# Patient Record
Sex: Male | Born: 1990 | Race: White | Hispanic: No | Marital: Married | Smoking: Never smoker
Health system: Southern US, Community
[De-identification: ages and names within clinical notes are randomized; demographics above are authoritative.]

## PROBLEM LIST (undated history)

## (undated) DIAGNOSIS — J45909 Unspecified asthma, uncomplicated: Secondary | ICD-10-CM

## (undated) DIAGNOSIS — F410 Panic disorder [episodic paroxysmal anxiety] without agoraphobia: Secondary | ICD-10-CM

## (undated) DIAGNOSIS — J302 Other seasonal allergic rhinitis: Secondary | ICD-10-CM

## (undated) HISTORY — DX: Panic disorder (episodic paroxysmal anxiety): F41.0

---

## 2016-11-08 HISTORY — PX: REFRACTIVE SURGERY: SHX103

## 2018-02-21 ENCOUNTER — Emergency Department
Admission: EM | Admit: 2018-02-21 | Discharge: 2018-02-21 | Disposition: A | Discharge status: Home / Self Care | Payer: Self-pay | Attending: Emergency Medicine | Admitting: Emergency Medicine

## 2018-02-21 ENCOUNTER — Other Ambulatory Visit: Payer: Self-pay

## 2018-02-21 ENCOUNTER — Encounter: Payer: Self-pay | Admitting: Emergency Medicine

## 2018-02-21 DIAGNOSIS — R062 Wheezing: Secondary | ICD-10-CM | POA: Insufficient documentation

## 2018-02-21 DIAGNOSIS — J301 Allergic rhinitis due to pollen: Secondary | ICD-10-CM | POA: Insufficient documentation

## 2018-02-21 DIAGNOSIS — R064 Hyperventilation: Secondary | ICD-10-CM | POA: Insufficient documentation

## 2018-02-21 HISTORY — DX: Other seasonal allergic rhinitis: J30.2

## 2018-02-21 HISTORY — DX: Unspecified asthma, uncomplicated: J45.909

## 2018-02-21 LAB — COMPREHENSIVE METABOLIC PANEL
ALT: 13 U/L — AB (ref 17–63)
AST: 33 U/L (ref 15–41)
Albumin: 4.9 g/dL (ref 3.5–5.0)
Alkaline Phosphatase: 67 U/L (ref 38–126)
Anion gap: 12 (ref 5–15)
BUN: 17 mg/dL (ref 6–20)
CHLORIDE: 106 mmol/L (ref 101–111)
CO2: 18 mmol/L — AB (ref 22–32)
CREATININE: 0.9 mg/dL (ref 0.61–1.24)
Calcium: 9.7 mg/dL (ref 8.9–10.3)
GFR calc Af Amer: >60 mL/min (ref >60)
GLUCOSE: 93 mg/dL (ref 65–99)
POTASSIUM: 3.2 mmol/L — AB (ref 3.5–5.1)
SODIUM: 136 mmol/L (ref 135–145)
Total Bilirubin: 0.7 mg/dL (ref 0.3–1.2)
Total Protein: 8.4 g/dL — ABNORMAL HIGH (ref 6.5–8.1)

## 2018-02-21 LAB — CBC WITH DIFFERENTIAL/PLATELET
Basophils Absolute: 0 10*3/uL (ref 0–0.1)
Basophils Relative: 1 %
EOS ABS: 0.1 10*3/uL (ref 0–0.7)
EOS PCT: 2 %
HCT: 44.8 % (ref 40.0–52.0)
Hemoglobin: 15.2 g/dL (ref 13.0–18.0)
LYMPHS ABS: 2 10*3/uL (ref 1.0–3.6)
LYMPHS PCT: 35 %
MCH: 30.8 pg (ref 26.0–34.0)
MCHC: 34 g/dL (ref 32.0–36.0)
MCV: 90.6 fL (ref 80.0–100.0)
Monocytes Absolute: 0.4 10*3/uL (ref 0.2–1.0)
Monocytes Relative: 7 %
Neutro Abs: 3.2 10*3/uL (ref 1.4–6.5)
Neutrophils Relative %: 55 %
PLATELETS: 224 10*3/uL (ref 150–440)
RBC: 4.95 MIL/uL (ref 4.40–5.90)
RDW: 13.6 % (ref 11.5–14.5)
WBC: 5.7 10*3/uL (ref 3.8–10.6)

## 2018-02-21 MED ORDER — IPRATROPIUM-ALBUTEROL 0.5-2.5 (3) MG/3ML IN SOLN
3.0000 mL | Freq: Once | RESPIRATORY_TRACT | Status: AC
  Administered 2018-02-21: 3 mL via RESPIRATORY_TRACT
  Filled 2018-02-21: qty 3

## 2018-02-21 MED ORDER — DIPHENHYDRAMINE HCL 50 MG/ML IJ SOLN
50.0000 mg | Freq: Once | INTRAMUSCULAR | Status: AC
  Administered 2018-02-21: 50 mg via INTRAVENOUS
  Filled 2018-02-21: qty 1

## 2018-02-21 MED ORDER — SODIUM CHLORIDE 0.9 % IV BOLUS
1000.0000 mL | Freq: Once | INTRAVENOUS | Status: AC
  Administered 2018-02-21: 1000 mL via INTRAVENOUS

## 2018-02-21 MED ORDER — METHYLPREDNISOLONE SODIUM SUCC 125 MG IJ SOLR
125.0000 mg | Freq: Once | INTRAMUSCULAR | Status: AC
  Administered 2018-02-21: 125 mg via INTRAVENOUS
  Filled 2018-02-21: qty 2

## 2018-02-21 MED ORDER — PREDNISONE 50 MG PO TABS: 50.0000 mg | ORAL_TABLET | Freq: Every day | ORAL | 0 refills | Status: DC

## 2018-02-21 MED ORDER — LORAZEPAM 2 MG/ML IJ SOLN
1.0000 mg | Freq: Once | INTRAMUSCULAR | Status: AC
  Administered 2018-02-21: 1 mg via INTRAVENOUS
  Filled 2018-02-21: qty 1
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
## 2018-02-21 NOTE — ED Triage Notes (Signed)
Pt was out on patrol and began coughing. He usually has trouble with pollen this time of year. Pt is hyperventilating. His hand are cramped up.

## 2019-01-13 ENCOUNTER — Encounter: Payer: Self-pay | Admitting: Emergency Medicine

## 2019-01-13 ENCOUNTER — Other Ambulatory Visit: Payer: Self-pay

## 2019-01-13 ENCOUNTER — Emergency Department: Payer: No Typology Code available for payment source

## 2019-01-13 ENCOUNTER — Emergency Department
Admission: EM | Admit: 2019-01-13 | Discharge: 2019-01-13 | Disposition: A | Discharge status: Home / Self Care | Payer: No Typology Code available for payment source | Attending: Emergency Medicine | Admitting: Emergency Medicine

## 2019-01-13 DIAGNOSIS — Y990 Civilian activity done for income or pay: Secondary | ICD-10-CM | POA: Insufficient documentation

## 2019-01-13 DIAGNOSIS — J45909 Unspecified asthma, uncomplicated: Secondary | ICD-10-CM | POA: Diagnosis not present

## 2019-01-13 DIAGNOSIS — Y9389 Activity, other specified: Secondary | ICD-10-CM | POA: Insufficient documentation

## 2019-01-13 DIAGNOSIS — W51XXXA Accidental striking against or bumped into by another person, initial encounter: Secondary | ICD-10-CM | POA: Insufficient documentation

## 2019-01-13 DIAGNOSIS — N50819 Testicular pain, unspecified: Secondary | ICD-10-CM

## 2019-01-13 DIAGNOSIS — S3994XA Unspecified injury of external genitals, initial encounter: Secondary | ICD-10-CM | POA: Diagnosis present

## 2019-01-13 DIAGNOSIS — Y929 Unspecified place or not applicable: Secondary | ICD-10-CM | POA: Insufficient documentation
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
## 2019-09-21 DIAGNOSIS — L508 Other urticaria: Secondary | ICD-10-CM | POA: Diagnosis not present

## 2020-01-12 DIAGNOSIS — Z23 Encounter for immunization: Secondary | ICD-10-CM | POA: Diagnosis not present

## 2020-02-02 DIAGNOSIS — Z23 Encounter for immunization: Secondary | ICD-10-CM | POA: Diagnosis not present
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
## 2020-05-01 ENCOUNTER — Other Ambulatory Visit: Payer: 59

## 2020-05-02 ENCOUNTER — Other Ambulatory Visit: Payer: Self-pay

## 2020-05-02 NOTE — Progress Notes (Signed)
Random urine drug screen collected on [facility] [id number] and released to [facility].  BAT testing conducted by Jeanie Cooks, CMA.

## 2020-07-10 ENCOUNTER — Other Ambulatory Visit: Payer: Self-pay

## 2020-07-10 DIAGNOSIS — Z0000 Encounter for general adult medical examination without abnormal findings: Secondary | ICD-10-CM

## 2020-07-10 LAB — POCT URINALYSIS DIPSTICK
Bilirubin, UA: NEGATIVE
Blood, UA: POSITIVE
Glucose, UA: NEGATIVE
Ketones, UA: NEGATIVE
Leukocytes, UA: NEGATIVE
Nitrite, UA: NEGATIVE
Protein, UA: NEGATIVE
Spec Grav, UA: >=1.03 — AB (ref 1.010–1.025)
Urobilinogen, UA: 0.2 E.U./dL
pH, UA: 5.5 (ref 5.0–8.0)

## 2020-07-10 NOTE — Progress Notes (Signed)
Scheduled to complete physical with  Durward Parcel, PA-C.  AMD

## 2020-07-11 LAB — CMP12+LP+TP+TSH+6AC+CBC/D/PLT
ALT: 13 IU/L (ref 0–44)
AST: 20 IU/L (ref 0–40)
Albumin/Globulin Ratio: 1.6 (ref 1.2–2.2)
Albumin: 4.6 g/dL (ref 4.1–5.2)
Alkaline Phosphatase: 64 IU/L (ref 48–121)
BUN/Creatinine Ratio: 14 (ref 9–20)
BUN: 14 mg/dL (ref 6–20)
Basophils Absolute: 0.1 10*3/uL (ref 0.0–0.2)
Basos: 1 %
Bilirubin Total: 0.3 mg/dL (ref 0.0–1.2)
Calcium: 9.7 mg/dL (ref 8.7–10.2)
Chloride: 103 mmol/L (ref 96–106)
Chol/HDL Ratio: 4.3 ratio (ref 0.0–5.0)
Cholesterol, Total: 180 mg/dL (ref 100–199)
Creatinine, Ser: 1.01 mg/dL (ref 0.76–1.27)
EOS (ABSOLUTE): 0.2 10*3/uL (ref 0.0–0.4)
Eos: 3 %
Estimated CHD Risk: 0.8 times avg. (ref 0.0–1.0)
Free Thyroxine Index: 2 (ref 1.2–4.9)
GFR calc Af Amer: 116 mL/min/{1.73_m2} (ref >59)
GFR calc non Af Amer: 100 mL/min/{1.73_m2} (ref >59)
GGT: 23 IU/L (ref 0–65)
Globulin, Total: 2.9 g/dL (ref 1.5–4.5)
Glucose: 96 mg/dL (ref 65–99)
HDL: 42 mg/dL (ref >39)
Hematocrit: 43.6 % (ref 37.5–51.0)
Hemoglobin: 14.3 g/dL (ref 13.0–17.7)
Immature Grans (Abs): 0 10*3/uL (ref 0.0–0.1)
Immature Granulocytes: 1 %
Iron: 74 ug/dL (ref 38–169)
LDH: 142 IU/L (ref 121–224)
LDL Chol Calc (NIH): 119 mg/dL — ABNORMAL HIGH (ref 0–99)
Lymphocytes Absolute: 2.1 10*3/uL (ref 0.7–3.1)
Lymphs: 37 %
MCH: 29.4 pg (ref 26.6–33.0)
MCHC: 32.8 g/dL (ref 31.5–35.7)
MCV: 90 fL (ref 79–97)
Monocytes Absolute: 0.5 10*3/uL (ref 0.1–0.9)
Monocytes: 8 %
Neutrophils Absolute: 2.9 10*3/uL (ref 1.4–7.0)
Neutrophils: 50 %
Phosphorus: 4.1 mg/dL (ref 2.8–4.1)
Platelets: 214 10*3/uL (ref 150–450)
Potassium: 4.5 mmol/L (ref 3.5–5.2)
RBC: 4.87 x10E6/uL (ref 4.14–5.80)
RDW: 12.7 % (ref 11.6–15.4)
Sodium: 138 mmol/L (ref 134–144)
T3 Uptake Ratio: 26 % (ref 24–39)
T4, Total: 7.8 ug/dL (ref 4.5–12.0)
TSH: 4.56 u[IU]/mL — ABNORMAL HIGH (ref 0.450–4.500)
Total Protein: 7.5 g/dL (ref 6.0–8.5)
Triglycerides: 106 mg/dL (ref 0–149)
Uric Acid: 5.5 mg/dL (ref 3.8–8.4)
VLDL Cholesterol Cal: 19 mg/dL (ref 5–40)
WBC: 5.7 10*3/uL (ref 3.4–10.8)

## 2020-07-17 ENCOUNTER — Encounter: Payer: Self-pay | Admitting: Physician Assistant

## 2020-07-18 ENCOUNTER — Ambulatory Visit: Payer: Self-pay | Admitting: Physician Assistant

## 2020-07-18 ENCOUNTER — Other Ambulatory Visit: Payer: Self-pay

## 2020-07-18 ENCOUNTER — Encounter: Payer: Self-pay | Admitting: Physician Assistant

## 2020-07-18 VITALS — BP 138/98 | HR 82 | Temp 98.7°F | Resp 16 | Ht 71.0 in | Wt 166.0 lb

## 2020-07-18 DIAGNOSIS — Z0000 Encounter for general adult medical examination without abnormal findings: Secondary | ICD-10-CM

## 2020-07-18 NOTE — Progress Notes (Signed)
   Subjective: Physical exam    Patient ID: Ryan Underwood, male    DOB: 04/20/1991, 29 y.o.   MRN: [id number]  HPI Patient presents for physical exam, voices no complaints or concerns.   Review of Systems    Negative Objective:   Physical Exam No acute distress.  HEENT is unremarkable.  Neck is supple for adenopathy or bruits.  Lungs are clear to auscultation.  Heart is regular rate and rhythm.  Abdomen with negative HSM, normoactive bowel sounds, soft and nontender to palpation.  No obvious deformity to the upper or lower extremities.  Patient has full and equal range of motion of the upper and lower extremities.  No obvious deformity to the cervical or lumbar spine.  Patient has full and equal range of motion of the cervical and lumbar spine.  Cranial nerves II through XII grossly intact.       Assessment & Plan: Well exam.  Discussed lab results with patient.  TSH was mildly elevated.  Patient has a history of hypothyroidism in his family.  We will repeat TSH in 6 months.

## 2020-09-18 ENCOUNTER — Other Ambulatory Visit: Payer: Self-pay

## 2020-09-18 DIAGNOSIS — Z0283 Encounter for blood-alcohol and blood-drug test: Secondary | ICD-10-CM

## 2020-09-18 NOTE — Progress Notes (Signed)
Presentsfor random drug screen & alcoholscreen.  [facility] acct # [id number] [facility] Specimen # [id number]  AMD

## 2020-10-24 ENCOUNTER — Other Ambulatory Visit: Payer: Self-pay

## 2020-10-24 ENCOUNTER — Ambulatory Visit: Payer: Self-pay | Admitting: Physician Assistant

## 2020-10-24 ENCOUNTER — Encounter: Payer: Self-pay | Admitting: Physician Assistant

## 2020-10-24 VITALS — BP 123/83 | HR 90 | Temp 97.9°F | Resp 14 | Ht 72.0 in | Wt 170.0 lb

## 2020-10-24 DIAGNOSIS — R300 Dysuria: Secondary | ICD-10-CM

## 2020-10-24 DIAGNOSIS — Z87442 Personal history of urinary calculi: Secondary | ICD-10-CM

## 2020-10-24 LAB — POCT URINALYSIS DIPSTICK
Bilirubin, UA: NEGATIVE
Glucose, UA: NEGATIVE
Ketones, UA: NEGATIVE
Leukocytes, UA: NEGATIVE
Nitrite, UA: NEGATIVE
Protein, UA: NEGATIVE
Spec Grav, UA: 1.025 (ref 1.010–1.025)
Urobilinogen, UA: 0.2 E.U./dL
pH, UA: 5.5 (ref 5.0–8.0)

## 2020-10-24 MED ORDER — ONDANSETRON HCL 8 MG PO TABS: 8.0000 mg | ORAL_TABLET | Freq: Three times a day (TID) | ORAL | 0 refills | Status: DC | PRN

## 2020-10-24 MED ORDER — TAMSULOSIN HCL 0.4 MG PO CAPS: 0.4000 mg | ORAL_CAPSULE | Freq: Every day | ORAL | 3 refills | Status: DC

## 2020-10-24 NOTE — Progress Notes (Signed)
° °  Subjective: Left leg pain    Patient ID: Ryan Underwood, male    DOB: 1991/09/23, 29 y.o.   MRN: [id number]  HPI Patient presents with left flank pain, mild dysuria, and hematuria.  Patient has history of kidney stones.  Patient rates pain as a 4/10.  Prescribed pain is "achy".  Patient denies any fever chills associated with complaint.  Patient also states nausea but no vomiting. Review of Systems    Negative except for complaint. Objective:   Physical Exam Patient no acute distress.  Patient has no guarding palpation left CVA area.  Patient has a moderate left abdominal pain.  Patient here was negative for hematuria.       Assessment & Plan: Flank pain  Right elbow patient presents with left flank pain with suspicion for kidney stone.  Patient given discharge care instruction advised to strain his urine.  Patient given prescription for Flomax and Zofran.  Patient advised to follow-up with the emergency room to consider CT scan if no improvement or worsening of pain.  Patient also advised that she have any urinary retention to follow-up with the emergency room.  Patient will follow up in 3 days for reevaluation.

## 2020-10-27 LAB — URINE CULTURE: Organism ID, Bacteria: NO GROWTH

## 2020-11-12 DIAGNOSIS — Z20822 Contact with and (suspected) exposure to covid-19: Secondary | ICD-10-CM | POA: Diagnosis not present

## 2020-11-12 DIAGNOSIS — Z03818 Encounter for observation for suspected exposure to other biological agents ruled out: Secondary | ICD-10-CM | POA: Diagnosis not present

## 2020-11-12 DIAGNOSIS — U071 COVID-19: Secondary | ICD-10-CM | POA: Diagnosis not present

## 2020-11-17 DIAGNOSIS — Z20822 Contact with and (suspected) exposure to covid-19: Secondary | ICD-10-CM | POA: Diagnosis not present

## 2020-11-17 DIAGNOSIS — Z03818 Encounter for observation for suspected exposure to other biological agents ruled out: Secondary | ICD-10-CM | POA: Diagnosis not present

## 2020-12-24 DIAGNOSIS — Z03818 Encounter for observation for suspected exposure to other biological agents ruled out: Secondary | ICD-10-CM | POA: Diagnosis not present

## 2020-12-24 DIAGNOSIS — Z20822 Contact with and (suspected) exposure to covid-19: Secondary | ICD-10-CM | POA: Diagnosis not present

## 2021-09-07 ENCOUNTER — Ambulatory Visit: Payer: Self-pay | Admitting: Physician Assistant

## 2021-09-08 ENCOUNTER — Ambulatory Visit: Payer: Self-pay | Admitting: Physician Assistant

## 2021-09-08 ENCOUNTER — Encounter: Payer: Self-pay | Admitting: Physician Assistant

## 2021-09-08 ENCOUNTER — Other Ambulatory Visit: Payer: Self-pay

## 2021-09-08 VITALS — BP 123/90 | HR 93 | Temp 98.2°F | Resp 12

## 2021-09-08 DIAGNOSIS — S93402A Sprain of unspecified ligament of left ankle, initial encounter: Secondary | ICD-10-CM

## 2021-09-08 NOTE — Progress Notes (Signed)
   Subjective: Left ankle sprain    Patient ID: Laureen Ochs, male    DOB: Nov 02, 1991, 30 y.o.   MRN: [id number]  HPI Patient presents for evaluation of left ankle sprain which occurred on 08/31/2021.  Patient state he was walking down steps and slipped but did not fall twisting his left ankle.  Patient said there was mild edema and pain treated with ibuprofen.  Patient stated pain is completely resolved and he is able to ambulate without discomfort.   Review of Systems Negative except for complaint    Objective:   Physical Exam No acute distress.  Temperature is 98.2, pulse 93, respiration 12, BP is 123/90, patient 97% O2 sat on room air.  Examination of the left ankle shows no obvious deformity, ecchymosis, or edema.  Palpable posterior tibial and dorsalis pedis pulse.  Patient has full and equal l range of motion.  No laxity with stress testing.  Normal gait.  Patient able to heel and toe walk.       Assessment & Plan: Left ankle sprain   Based on patient complaint physical exam he returned back to duty without limitations.

## 2022-11-23 ENCOUNTER — Ambulatory Visit: Payer: Self-pay

## 2022-11-23 DIAGNOSIS — Z0283 Encounter for blood-alcohol and blood-drug test: Secondary | ICD-10-CM

## 2022-11-23 NOTE — Progress Notes (Signed)
Presents to [place] for random drug screen & breath alcohol screen.  [facility] Acct #:  [id number] [facility] Specimen #:  [id number]  Breath Alcohol Screen results = 0.000  AMD

## 2023-03-15 ENCOUNTER — Ambulatory Visit: Payer: Self-pay

## 2023-03-15 DIAGNOSIS — Z0000 Encounter for general adult medical examination without abnormal findings: Secondary | ICD-10-CM

## 2023-03-15 LAB — POCT URINALYSIS DIPSTICK
Bilirubin, UA: NEGATIVE
Blood, UA: NEGATIVE
Glucose, UA: NEGATIVE
Ketones, UA: NEGATIVE
Leukocytes, UA: NEGATIVE
Nitrite, UA: NEGATIVE
Protein, UA: NEGATIVE
Spec Grav, UA: 1.015 (ref 1.010–1.025)
Urobilinogen, UA: 0.2 E.U./dL
pH, UA: 7 (ref 5.0–8.0)

## 2023-03-15 NOTE — Progress Notes (Signed)
Pt completed labs for scheduled physical. ?

## 2023-03-16 LAB — CMP12+LP+TP+TSH+6AC+CBC/D/PLT
ALT: 21 IU/L (ref 0–44)
AST: 24 IU/L (ref 0–40)
Albumin/Globulin Ratio: 1.4 (ref 1.2–2.2)
Albumin: 4.9 g/dL (ref 4.1–5.1)
Alkaline Phosphatase: 79 IU/L (ref 44–121)
BUN/Creatinine Ratio: 17 (ref 9–20)
BUN: 15 mg/dL (ref 6–20)
Basophils Absolute: 0.1 10*3/uL (ref 0.0–0.2)
Basos: 2 %
Bilirubin Total: 0.4 mg/dL (ref 0.0–1.2)
Calcium: 10 mg/dL (ref 8.7–10.2)
Chloride: 101 mmol/L (ref 96–106)
Chol/HDL Ratio: 4.7 ratio (ref 0.0–5.0)
Cholesterol, Total: 202 mg/dL — ABNORMAL HIGH (ref 100–199)
Creatinine, Ser: 0.89 mg/dL (ref 0.76–1.27)
EOS (ABSOLUTE): 0.2 10*3/uL (ref 0.0–0.4)
Eos: 4 %
Estimated CHD Risk: 0.9 times avg. (ref 0.0–1.0)
Free Thyroxine Index: 2.2 (ref 1.2–4.9)
GGT: 34 IU/L (ref 0–65)
Globulin, Total: 3.4 g/dL (ref 1.5–4.5)
Glucose: 91 mg/dL (ref 70–99)
HDL: 43 mg/dL (ref >39)
Hematocrit: 45.2 % (ref 37.5–51.0)
Hemoglobin: 15.1 g/dL (ref 13.0–17.7)
Immature Grans (Abs): 0 10*3/uL (ref 0.0–0.1)
Immature Granulocytes: 0 %
Iron: 84 ug/dL (ref 38–169)
LDH: 150 IU/L (ref 121–224)
LDL Chol Calc (NIH): 146 mg/dL — ABNORMAL HIGH (ref 0–99)
Lymphocytes Absolute: 1.5 10*3/uL (ref 0.7–3.1)
Lymphs: 36 %
MCH: 29.9 pg (ref 26.6–33.0)
MCHC: 33.4 g/dL (ref 31.5–35.7)
MCV: 90 fL (ref 79–97)
Monocytes Absolute: 0.4 10*3/uL (ref 0.1–0.9)
Monocytes: 10 %
Neutrophils Absolute: 2 10*3/uL (ref 1.4–7.0)
Neutrophils: 48 %
Phosphorus: 2.8 mg/dL (ref 2.8–4.1)
Platelets: 237 10*3/uL (ref 150–450)
Potassium: 4.6 mmol/L (ref 3.5–5.2)
RBC: 5.05 x10E6/uL (ref 4.14–5.80)
RDW: 12.9 % (ref 11.6–15.4)
Sodium: 139 mmol/L (ref 134–144)
T3 Uptake Ratio: 27 % (ref 24–39)
T4, Total: 8 ug/dL (ref 4.5–12.0)
TSH: 2.1 u[IU]/mL (ref 0.450–4.500)
Total Protein: 8.3 g/dL (ref 6.0–8.5)
Triglycerides: 73 mg/dL (ref 0–149)
Uric Acid: 5.4 mg/dL (ref 3.8–8.4)
VLDL Cholesterol Cal: 13 mg/dL (ref 5–40)
WBC: 4.2 10*3/uL (ref 3.4–10.8)
eGFR: 117 mL/min/{1.73_m2} (ref >59)

## 2023-03-22 ENCOUNTER — Encounter: Payer: 59 | Admitting: Physician Assistant

## 2023-03-24 ENCOUNTER — Ambulatory Visit: Payer: Self-pay | Admitting: Physician Assistant

## 2023-03-24 ENCOUNTER — Encounter: Payer: Self-pay | Admitting: Physician Assistant

## 2023-03-24 VITALS — BP 124/94 | HR 68 | Temp 97.3°F | Ht 71.0 in | Wt 180.0 lb

## 2023-03-24 DIAGNOSIS — Z3009 Encounter for other general counseling and advice on contraception: Secondary | ICD-10-CM

## 2023-03-24 DIAGNOSIS — Z0000 Encounter for general adult medical examination without abnormal findings: Secondary | ICD-10-CM

## 2023-03-24 NOTE — Progress Notes (Signed)
Here for yearly physical with [facility] provider.  Denies any complaints.  Stated he needs to find a urologist and ready for a vasectomy.

## 2023-03-24 NOTE — Addendum Note (Signed)
Addended by: Gardner Candle on: 03/24/2023 10:54 AM   Modules accepted: Orders

## 2023-03-24 NOTE — Progress Notes (Addendum)
[facility] ____________________________________________   None    (approximate)  I have reviewed the triage vital signs and the nursing notes.   HISTORY  Chief Complaint No chief complaint on file.    HPI Ryan Underwood is a 32 y.o. male patient here for annual physical exam.  Patient request consult to urologist for vasectomy.  Patient states he has 2 children 1 boy and 1 girl.  States wife is amenable for procedure.         Past Medical History:  Diagnosis Date   Asthma    pt stated does not have asthma   Panic attack    Seasonal allergies     There are no problems to display for this patient.   Past Surgical History:  Procedure Laterality Date   REFRACTIVE SURGERY  2018    Prior to Admission medications   Medication Sig Start Date End Date Taking? Authorizing Provider  fluticasone (FLONASE) 50 MCG/ACT nasal spray Place 2 sprays into both nostrils daily. Seasonal allergies   Yes [provider]  loratadine (CLARITIN REDITABS) 10 MG dissolvable tablet Take 10 mg by mouth daily. Seasonal allergies   Yes [provider]    Allergies Patient has no known allergies.  Family History  Problem Relation Age of Onset   Hypertension Father    Hypertension Sister     Social History Social History   Tobacco Use   Smoking status: Never   Smokeless tobacco: Never  Substance Use Topics   Alcohol use: Not Currently   Drug use: Never    Review of Systems Constitutional: No fever/chills Eyes: No visual changes. ENT: No sore throat. Cardiovascular: Denies chest pain. Respiratory: Denies shortness of breath. Gastrointestinal: No abdominal pain.  No nausea, no vomiting.  No diarrhea.  No constipation. Genitourinary: Negative for dysuria. Musculoskeletal: Negative for back pain. Skin: Negative for rash. Neurological: Negative for headaches, focal weakness or  numbness.  ____________________________________________   PHYSICAL EXAM: VITAL SIGNS:  BP 124/94  Pulse 68  Temp 97.3 F (36.3 C)  Weight 180 lb (81.6 kg)  Height 5\' 11"  (1.803 m)   BMI 25.10 kg/m2  BSA 2.02 m2   Constitutional: Alert and oriented. Well appearing and in no acute distress. Eyes: Conjunctivae are normal. PERRL. EOMI. Head: Atraumatic. Nose: No congestion/rhinnorhea. Mouth/Throat: Mucous membranes are moist.  Oropharynx non-erythematous. Neck: No stridor.  No cervical spine tenderness to palpation. Hematological/Lymphatic/Immunilogical: No cervical lymphadenopathy. Cardiovascular: Normal rate, regular rhythm. Grossly normal heart sounds.  Good peripheral circulation. Respiratory: Normal respiratory effort.  No retractions. Lungs CTAB. Gastrointestinal: Soft and nontender. No distention. No abdominal bruits. No CVA tenderness. Genitourinary: Deferred Musculoskeletal: No lower extremity tenderness nor edema.  No joint effusions. Neurologic:  Normal speech and language. No gross focal neurologic deficits are appreciated. No gait instability. Skin:  Skin is warm, dry and intact. No rash noted. Psychiatric: Mood and affect are normal. Speech and behavior are normal.  ____________________________________________   LABS _  Ref Range & Units 9 d ago (03/15/23) 2 yr ago (10/24/20) 2 yr ago (07/10/20)  Color, UA yellow yellow Yellow  Clarity, UA clear clear Clear  Glucose, UA Negative Negative Negative Negative  Bilirubin, UA neg negative Negative  Ketones, UA neg negaitve Negative  Spec Grav, UA 1.010 - 1.025 1.015 1.025 >=1.030 Abnormal   Blood, UA neg postive CM Positive CM  pH, UA 5.0 - 8.0 7.0 5.5 5.5  Protein, UA Negative Negative Negative Negative  Urobilinogen, UA 0.2  or 1.0 E.U./dL 0.2 0.2 0.2  Nitrite, UA neg negative Negative  Leukocytes, UA Negative Negative Negative Negative  Appearance  light   Odor                   Component Ref  Range & Units 9 d ago (03/15/23) 2 yr ago (07/10/20) 5 yr ago (02/21/18) 5 yr ago (02/21/18)  Glucose 70 - 99 mg/dL 91 96 R  93 R  Uric Acid 3.8 - 8.4 mg/dL 5.4 5.5 CM    Comment:            Therapeutic target for gout patients: <6.0  BUN 6 - 20 mg/dL 15 14  17   Creatinine, Ser 0.76 - 1.27 mg/dL 2.95 2.84  1.32 R  eGFR >59 mL/min/1.73 117     BUN/Creatinine Ratio 9 - 20 17 14     Sodium 134 - 144 mmol/L 139 138  136 R  Potassium 3.5 - 5.2 mmol/L 4.6 4.5  3.2 Low  R  Chloride 96 - 106 mmol/L 101 103  106 R  Calcium 8.7 - 10.2 mg/dL 44.0 9.7  9.7 R  Phosphorus 2.8 - 4.1 mg/dL 2.8 4.1    Total Protein 6.0 - 8.5 g/dL 8.3 7.5  8.4 High  R  Albumin 4.1 - 5.1 g/dL 4.9 4.6 R  4.9 R  Globulin, Total 1.5 - 4.5 g/dL 3.4 2.9    Albumin/Globulin Ratio 1.2 - 2.2 1.4 1.6    Bilirubin Total 0.0 - 1.2 mg/dL 0.4 0.3  0.7 R  Alkaline Phosphatase 44 - 121 IU/L 79 64 R, CM  67 R  LDH 121 - 224 IU/L 150 142    AST 0 - 40 IU/L 24 20  33 R  ALT 0 - 44 IU/L 21 13  13  Low  R  GGT 0 - 65 IU/L 34 23    Iron 38 - 169 ug/dL 84 74    Cholesterol, Total 100 - 199 mg/dL 102 High  725    Triglycerides 0 - 149 mg/dL 73 366    HDL >44 mg/dL 43 42    VLDL Cholesterol Cal 5 - 40 mg/dL 13 19    LDL Chol Calc (NIH) 0 - 99 mg/dL 034 High  742 High     Chol/HDL Ratio 0.0 - 5.0 ratio 4.7 4.3 CM    Comment:                                   T. Chol/HDL Ratio                                             Men  Women                               1/2 Avg.Risk  3.4    3.3                                   Avg.Risk  5.0    4.4  2X Avg.Risk  9.6    7.1                                3X Avg.Risk 23.4   11.0  Estimated CHD Risk 0.0 - 1.0 times avg. 0.9 0.8 CM    Comment: The CHD Risk is based on the T. Chol/HDL ratio. Other factors affect CHD Risk such as hypertension, smoking, diabetes, severe obesity, and family history of premature CHD.  TSH 0.450 - 4.500 uIU/mL 2.100  4.560 High     T4, Total 4.5 - 12.0 ug/dL 8.0 7.8    T3 Uptake Ratio 24 - 39 % 27 26    Free Thyroxine Index 1.2 - 4.9 2.2 2.0    WBC 3.4 - 10.8 x10E3/uL 4.2 5.7 5.7 R   RBC 4.14 - 5.80 x10E6/uL 5.05 4.87 4.95 R   Hemoglobin 13.0 - 17.7 g/dL 16.1 09.6 04.5 R   Hematocrit 37.5 - 51.0 % 45.2 43.6 44.8 R   MCV 79 - 97 fL 90 90 90.6 R   MCH 26.6 - 33.0 pg 29.9 29.4 30.8 R   MCHC 31.5 - 35.7 g/dL 40.9 81.1 91.4 R   RDW 11.6 - 15.4 % 12.9 12.7 13.6 R   Platelets 150 - 450 x10E3/uL 237 214 224 R   Neutrophils Not Estab. % 48 50 55 R   Lymphs Not Estab. % 36 37    Monocytes Not Estab. % 10 8    Eos Not Estab. % 4 3    Basos Not Estab. % 2 1    Neutrophils Absolute 1.4 - 7.0 x10E3/uL 2.0 2.9 3.2 R   Lymphocytes Absolute 0.7 - 3.1 x10E3/uL 1.5 2.1 2.0 R   Monocytes Absolute 0.1 - 0.9 x10E3/uL 0.4 0.5    EOS (ABSOLUTE) 0.0 - 0.4 x10E3/uL 0.2 0.2    Basophils Absolute 0.0 - 0.2 x10E3/uL 0.1 0.1 0.0 R, CM   Immature Granulocytes Not Estab. % 0 1    Immature Grans (Abs)          ___________________________________________    ____________________________________________   INITIAL IMPRESSION / ASSESSMENT AND PLAN As part of my medical decision making, I reviewed the following data within the electronic MEDICAL RECORD NUMBER         Acute findings on physical exam and labs.     ____________________________________________   FINAL CLINICAL IMPRESSION Well exam   ED Discharge Orders     None        Note:  This document was prepared using Dragon voice recognition software and may include unintentional dictation errors.

## 2023-05-04 ENCOUNTER — Ambulatory Visit: Payer: 59 | Admitting: Urology

## 2023-05-04 ENCOUNTER — Encounter: Payer: Self-pay | Admitting: Urology

## 2023-05-04 VITALS — BP 133/89 | HR 81 | Ht 71.0 in | Wt 179.0 lb

## 2023-05-04 DIAGNOSIS — Z3009 Encounter for other general counseling and advice on contraception: Secondary | ICD-10-CM

## 2023-05-04 MED ORDER — DIAZEPAM 5 MG PO TABS: 5.0000 mg | ORAL_TABLET | Freq: Once | ORAL | 0 refills | Status: DC | PRN

## 2023-05-04 NOTE — Patient Instructions (Signed)

## 2023-05-04 NOTE — Progress Notes (Signed)
   05/04/23 3:13 PM   Ryan Underwood 1991/03/15 [id number]  CC: Discuss vasectomy  HPI: 32 year old male with 2 children interested in vasectomy for permanent sterilization.  He denies any urinary problems, no family history of prostate cancer.   PMH: Past Medical History:  Diagnosis Date   Asthma    pt stated does not have asthma   Panic attack    Seasonal allergies     Surgical History: Past Surgical History:  Procedure Laterality Date   REFRACTIVE SURGERY  2018     Family History: Family History  Problem Relation Age of Onset   Hypertension Father    Hypertension Sister     Social History:  reports that he has never smoked. He has never been exposed to tobacco smoke. He has never used smokeless tobacco. He reports that he does not currently use alcohol. He reports that he does not use drugs.  Physical Exam: BP 133/89   Pulse 81   Ht 5\' 11"  (1.803 m)   Wt 179 lb (81.2 kg)   BMI 24.97 kg/m    Constitutional:  Alert and oriented, No acute distress. Cardiovascular: No clubbing, cyanosis, or edema. Respiratory: Normal respiratory effort, no increased work of breathing. GI: Abdomen is soft, nontender, nondistended, no abdominal masses GU: Circumcised phallus without lesions, patent meatus, testicles 20 cc and descended bilaterally without masses, vas deferens easily palpable bilaterally   Assessment & Plan:   Healthy 32 year old male interested in vasectomy for permanent sterilization.  We discussed the risks and benefits of vasectomy at length.  Vasectomy is intended to be a permanent form of contraception, and does not produce immediate sterility.  Following vasectomy another form of contraception is required until vas occlusion is confirmed by a post-vasectomy semen analysis obtained 2-3 months after the procedure.  Even after vas occlusion is confirmed, vasectomy is not 100% reliable in preventing pregnancy, and the failure rate is approximately 11/1998.  Repeat  vasectomy is required in less than 1% of patients.  He should refrain from ejaculation for 1 week after vasectomy.  Options for fertility after vasectomy include vasectomy reversal, and sperm retrieval with in vitro fertilization or ICSI.  These options are not always successful and may be expensive.  Finally, there are other permanent and non-permanent alternatives to vasectomy available. There is no risk of erectile dysfunction, and the volume of semen will be similar to prior, as the majority of the ejaculate is from the prostate and seminal vesicles.   The procedure takes ~20 minutes.  We recommend patients take 5-10 mg of Valium 30 minutes prior, and he will need a driver post-procedure.  Local anesthetic is injected into the scrotal skin and a small segment of the vas deferens is removed, and the ends occluded. The complication rate is approximately 1-2%, and includes bleeding, infection, and development of chronic scrotal pain.  PLAN: Schedule vasectomy Valium sent to pharmacy    Legrand Rams, MD 05/04/2023  [facility] [address] [place], [place] [address] [phone]

## 2023-07-06 ENCOUNTER — Encounter: Payer: 59 | Admitting: Urology

## 2023-11-14 ENCOUNTER — Other Ambulatory Visit: Payer: Self-pay

## 2023-11-14 NOTE — Progress Notes (Unsigned)
 Received the following email from Josh:  Juanita Caldron,   I hope you are doing well.  I wanted to see if you think that I should set an appointment up for a small concern. I have recently noticed a small bump that I can feel with my fingers on my stomach. It feels to be about the size of a pee, outside the muscle wall but under the skin. It feels smooth and round. It's on my right side above my belly button in between my abs.  There is no pain or pressure associated with it, regardless of how I press on it. I cannot visually see the bump just happened to find when bathing.  I noticed in several months ago and have not felt any changes.   Thanks for your time on the matter.   If you have any questions, you can call me on my cell. [phone]  Thanks,  Fonda Lewis  [facility] [employer]  Reviewed email with Tanda Sharps, PA-C. Appt scheduled for 11/29/2023 at 8:15 am (Josh is out of town for the next two weeks).

## 2023-11-29 ENCOUNTER — Encounter: Payer: Self-pay | Admitting: Physician Assistant

## 2023-11-29 ENCOUNTER — Ambulatory Visit: Payer: Self-pay | Admitting: Physician Assistant

## 2023-11-29 VITALS — BP 145/94 | Temp 97.6°F | Resp 16 | Ht 72.0 in | Wt 185.0 lb

## 2023-11-29 DIAGNOSIS — R1310 Dysphagia, unspecified: Secondary | ICD-10-CM

## 2023-11-29 DIAGNOSIS — D171 Benign lipomatous neoplasm of skin and subcutaneous tissue of trunk: Secondary | ICD-10-CM

## 2023-11-29 NOTE — Progress Notes (Signed)
Pt believes he has a hernia, doesn't recall how long its been there x 1 year or more possibly. Right side of naval. Pt denies any pain or discomfort.   Pt also complains of acid reflux and has gotten worse over the past 6-8 months. Pt describes it as food getting stuck at to top of stomach and hurts. Only relief is spitting it back up or taking a tum/CL,RMA

## 2023-11-29 NOTE — Progress Notes (Signed)
   Subjective: Abdominal lesion    Patient ID: Ryan Underwood, male    DOB: 1990/11/24, 33 y.o.   MRN: [id number]  HPI Patient complaining of palpable lesion to the right umbilical area.  States he is unsure of how long the lesion has been present.  Denies any pain with complaint.  Denies any nausea vomiting diarrhea.  Patient also complaining of trouble swallowing food for the past year.  States complaints most consistent of more solid food i.e. steak.   Review of Systems Negative except for above complaint    Objective:   Physical Exam BP 145/94BP. 145/94. Data is abnormal. Taken on 11/29/23 8:19 AM  Temp 97.6 F (36.4 C)  Temp Source Temporal  Weight 185 lb (83.9 kg)  Height 6' (1.829 m)  Resp 16  No acute distress.  Normoactive bowel sounds.  Negative HSM.  Palpable lesion measuring approximately 0.5 cm right lateral aspect of umbilicus.       Assessment & Plan: Lipoma versus hernia.  Patient had an abdominal ultrasound and a consult to GI.  Limited mostly

## 2023-11-29 NOTE — Addendum Note (Signed)
Addended by: Gardner Candle on: 11/29/2023 10:37 AM   Modules accepted: Orders

## 2023-11-30 ENCOUNTER — Other Ambulatory Visit: Payer: Self-pay | Admitting: Physician Assistant

## 2023-11-30 ENCOUNTER — Ambulatory Visit
Admission: RE | Admit: 2023-11-30 | Discharge: 2023-11-30 | Disposition: A | Discharge status: Home / Self Care | Payer: 59 | Source: Ambulatory Visit | Attending: Physician Assistant | Admitting: Physician Assistant

## 2023-11-30 DIAGNOSIS — D171 Benign lipomatous neoplasm of skin and subcutaneous tissue of trunk: Secondary | ICD-10-CM | POA: Insufficient documentation

## 2023-11-30 DIAGNOSIS — R1310 Dysphagia, unspecified: Secondary | ICD-10-CM

## 2024-04-25 ENCOUNTER — Ambulatory Visit: Payer: Self-pay

## 2024-04-25 DIAGNOSIS — Z0000 Encounter for general adult medical examination without abnormal findings: Secondary | ICD-10-CM

## 2024-04-26 LAB — CMP12+LP+TP+TSH+6AC+CBC/D/PLT
ALT: 49 IU/L — ABNORMAL HIGH (ref 0–44)
AST: 39 IU/L (ref 0–40)
Albumin: 4.9 g/dL (ref 4.1–5.1)
Alkaline Phosphatase: 82 IU/L (ref 44–121)
BUN/Creatinine Ratio: 12 (ref 9–20)
BUN: 11 mg/dL (ref 6–20)
Basophils Absolute: 0.1 10*3/uL (ref 0.0–0.2)
Basos: 2 %
Bilirubin Total: 0.4 mg/dL (ref 0.0–1.2)
Calcium: 10.2 mg/dL (ref 8.7–10.2)
Chloride: 100 mmol/L (ref 96–106)
Chol/HDL Ratio: 5.5 ratio — ABNORMAL HIGH (ref 0.0–5.0)
Cholesterol, Total: 259 mg/dL — ABNORMAL HIGH (ref 100–199)
Creatinine, Ser: 0.89 mg/dL (ref 0.76–1.27)
EOS (ABSOLUTE): 0.3 10*3/uL (ref 0.0–0.4)
Eos: 5 %
Estimated CHD Risk: 1.2 times avg. — ABNORMAL HIGH (ref 0.0–1.0)
Free Thyroxine Index: 1.7 (ref 1.2–4.9)
GGT: 66 IU/L — ABNORMAL HIGH (ref 0–65)
Globulin, Total: 3.2 g/dL (ref 1.5–4.5)
Glucose: 85 mg/dL (ref 70–99)
HDL: 47 mg/dL (ref >39)
Hematocrit: 48.2 % (ref 37.5–51.0)
Hemoglobin: 15.8 g/dL (ref 13.0–17.7)
Immature Grans (Abs): 0 10*3/uL (ref 0.0–0.1)
Immature Granulocytes: 0 %
Iron: 134 ug/dL (ref 38–169)
LDH: 205 IU/L (ref 121–224)
LDL Chol Calc (NIH): 178 mg/dL — ABNORMAL HIGH (ref 0–99)
Lymphocytes Absolute: 1.4 10*3/uL (ref 0.7–3.1)
Lymphs: 29 %
MCH: 29.9 pg (ref 26.6–33.0)
MCHC: 32.8 g/dL (ref 31.5–35.7)
MCV: 91 fL (ref 79–97)
Monocytes Absolute: 0.4 10*3/uL (ref 0.1–0.9)
Monocytes: 9 %
Neutrophils Absolute: 2.7 10*3/uL (ref 1.4–7.0)
Neutrophils: 55 %
Phosphorus: 2.3 mg/dL — ABNORMAL LOW (ref 2.8–4.1)
Platelets: 232 10*3/uL (ref 150–450)
Potassium: 4.4 mmol/L (ref 3.5–5.2)
RBC: 5.29 x10E6/uL (ref 4.14–5.80)
RDW: 13 % (ref 11.6–15.4)
Sodium: 135 mmol/L (ref 134–144)
T3 Uptake Ratio: 26 % (ref 24–39)
T4, Total: 6.7 ug/dL (ref 4.5–12.0)
TSH: 2.85 u[IU]/mL (ref 0.450–4.500)
Total Protein: 8.1 g/dL (ref 6.0–8.5)
Triglycerides: 183 mg/dL — ABNORMAL HIGH (ref 0–149)
Uric Acid: 5.3 mg/dL (ref 3.8–8.4)
VLDL Cholesterol Cal: 34 mg/dL (ref 5–40)
WBC: 5 10*3/uL (ref 3.4–10.8)
eGFR: 116 mL/min/{1.73_m2} (ref >59)

## 2024-05-01 ENCOUNTER — Encounter: Payer: Self-pay | Admitting: Physician Assistant

## 2024-05-01 ENCOUNTER — Ambulatory Visit: Payer: Self-pay | Admitting: Physician Assistant

## 2024-05-01 VITALS — BP 135/95 | HR 71 | Resp 16 | Ht 71.0 in | Wt 180.0 lb

## 2024-05-01 DIAGNOSIS — K2100 Gastro-esophageal reflux disease with esophagitis, without bleeding: Secondary | ICD-10-CM

## 2024-05-01 DIAGNOSIS — Z0000 Encounter for general adult medical examination without abnormal findings: Secondary | ICD-10-CM

## 2024-05-01 DIAGNOSIS — E782 Mixed hyperlipidemia: Secondary | ICD-10-CM

## 2024-05-01 LAB — POCT URINALYSIS DIPSTICK
Bilirubin, UA: NEGATIVE
Glucose, UA: NEGATIVE
Ketones, UA: NEGATIVE
Leukocytes, UA: NEGATIVE
Nitrite, UA: NEGATIVE
Protein, UA: POSITIVE — AB
Spec Grav, UA: 1.015 (ref 1.010–1.025)
Urobilinogen, UA: 0.2 U/dL
pH, UA: 6 (ref 5.0–8.0)

## 2024-05-01 MED ORDER — FAMOTIDINE 40 MG PO TABS: 40.0000 mg | ORAL_TABLET | Freq: Every day | ORAL | 3 refills | Status: DC

## 2024-05-01 MED ORDER — ROSUVASTATIN CALCIUM 40 MG PO TABS: 40.0000 mg | ORAL_TABLET | Freq: Every day | ORAL | 3 refills | Status: AC

## 2024-05-01 NOTE — Progress Notes (Signed)
 Pt presents today to complete physical, Pt denies any concerns at this time Ryan Underwood

## 2024-05-01 NOTE — Progress Notes (Signed)
 [facility]  ____________________________________________   None    (approximate)  I have reviewed the triage vital signs and the nursing notes.   HISTORY  Chief Complaint No chief complaint on file.   HPI Ryan Underwood is a 33 y.o. male patient presents for annual physical exam.  Patient was concerned for GERD.  Patient states attains mild to moderate release with over-the-counter Prilosec.  Request prescription strength.         Past Medical History:  Diagnosis Date   Asthma    pt stated does not have asthma   Panic attack    Seasonal allergies     There are no active problems to display for this patient.   Past Surgical History:  Procedure Laterality Date   REFRACTIVE SURGERY  2018    Prior to Admission medications   Medication Sig Start Date End Date Taking? Authorizing Provider  fluticasone (FLONASE) 50 MCG/ACT nasal spray Place 2 sprays into both nostrils daily. Seasonal allergies Patient not taking: Reported on 11/29/2023    [provider]  loratadine (CLARITIN REDITABS) 10 MG dissolvable tablet Take 10 mg by mouth daily. Seasonal allergies Patient not taking: Reported on 11/29/2023    [provider]    Allergies Patient has no known allergies.  Family History  Problem Relation Age of Onset   Hypertension Father    Hypertension Sister     Social History Social History   Tobacco Use   Smoking status: Never    Passive exposure: Never   Smokeless tobacco: Never  Substance Use Topics   Alcohol use: Not Currently   Drug use: Never    Review of Systems Constitutional: No fever/chills Eyes: No visual changes. ENT: No sore throat. Cardiovascular: Denies chest pain. Respiratory: Denies shortness of breath. Gastrointestinal: No abdominal pain.  No nausea, no vomiting.  No diarrhea.  No constipation.  GERD. Genitourinary: Negative for dysuria. Musculoskeletal: Negative for back  pain. Skin: Negative for rash. Neurological: Negative for headaches, focal weakness or numbness.  ____________________________________________   PHYSICAL EXAM:  VITAL SIGNS: BP 135/95  Cuff Size Normal  Pulse Rate 71  Weight 180 lb (81.6 kg)  Height 5' 11 (1.803 m)  Resp 16  SpO2 99 %   BMI: 25.10 kg/m2  BSA: 2.02 m2   Constitutional: Alert and oriented. Well appearing and in no acute distress. Eyes: Conjunctivae are normal. PERRL. EOMI. Head: Atraumatic. Nose: No congestion/rhinnorhea. Mouth/Throat: Mucous membranes are moist.  Oropharynx non-erythematous. Neck: No stridor.  No cervical spine tenderness to palpation. Hematological/Lymphatic/Immunilogical: No cervical lymphadenopathy. Cardiovascular: Normal rate, regular rhythm. Grossly normal heart sounds.  Good peripheral circulation. Respiratory: Normal respiratory effort.  No retractions. Lungs CTAB. Gastrointestinal: Soft and nontender. No distention. No abdominal bruits. No CVA tenderness. Genitourinary: Deferred Musculoskeletal: No lower extremity tenderness nor edema.  No joint effusions. Neurologic:  Normal speech and language. No gross focal neurologic deficits are appreciated. No gait instability. Skin:  Skin is warm, dry and intact. No rash noted. Psychiatric: Mood and affect are normal. Speech and behavior are normal.  ____________________________________________   LABS          Component Ref Range & Units (hover) 6 d ago (04/25/24) 1 yr ago (03/15/23) 3 yr ago (07/10/20) 6 yr ago (02/21/18) 6 yr ago (02/21/18)  Glucose 85 91 96 R  93 R  Uric Acid 5.3 5.4 CM 5.5 CM    Comment:            Therapeutic  target for gout patients: <6.0  BUN 11 15 14  17   Creatinine, Ser 0.89 0.89 1.01  0.90 R  eGFR 116 117     BUN/Creatinine Ratio 12 17 14     Sodium 135 139 138  136 R  Potassium 4.4 4.6 4.5  3.2 Low  R  Chloride 100 101 103  106 R  Calcium 10.2 10.0 9.7  9.7 R  Phosphorus 2.3 Low  2.8 4.1    Total  Protein 8.1 8.3 7.5  8.4 High  R  Albumin 4.9 4.9 4.6 R  4.9 R  Globulin, Total 3.2 3.4 2.9    Bilirubin Total 0.4 0.4 0.3  0.7 R  Alkaline Phosphatase 82 79 64 R, CM  67 R  LDH 205 150 142    AST 39 24 20  33 R  ALT 49 High  21 13  13  Low  R  GGT 66 High  34 23    Iron 134 84 74    Cholesterol, Total 259 High  202 High  180    Triglycerides 183 High  73 106    HDL 47 43 42    VLDL Cholesterol Cal 34 13 19    LDL Chol Calc (NIH) 178 High  146 High  119 High     Chol/HDL Ratio 5.5 High  4.7 CM 4.3 CM    Comment:                                   T. Chol/HDL Ratio                                             Men  Women                               1/2 Avg.Risk  3.4    3.3                                   Avg.Risk  5.0    4.4                                2X Avg.Risk  9.6    7.1                                3X Avg.Risk 23.4   11.0  Estimated CHD Risk 1.2 High  0.9 CM 0.8 CM    Comment: The CHD Risk is based on the T. Chol/HDL ratio. Other factors affect CHD Risk such as hypertension, smoking, diabetes, severe obesity, and family history of premature CHD.  TSH 2.850 2.100 4.560 High     T4, Total 6.7 8.0 7.8    T3 Uptake Ratio 26 27 26     Free Thyroxine Index 1.7 2.2 2.0    WBC 5.0 4.2 5.7 5.7 R   RBC 5.29 5.05 4.87 4.95 R   Hemoglobin 15.8 15.1 14.3 15.2 R   Hematocrit 48.2 45.2 43.6 44.8 R   MCV 91 90 90 90.6 R   MCH 29.9 29.9 29.4  30.8 R   MCHC 32.8 33.4 32.8 34.0 R   RDW 13.0 12.9 12.7 13.6 R   Platelets 232 237 214 224 R   Neutrophils 55 48 50 55 R   Lymphs 29 36 37    Monocytes 9 10 8     Eos 5 4 3     Basos 2 2 1     Neutrophils Absolute 2.7 2.0 2.9 3.2 R   Lymphocytes Absolute 1.4 1.5 2.1 2.0 R   Monocytes Absolute 0.4 0.4 0.5    EOS (ABSOLUTE) 0.3 0.2 0.2    Basophils Absolute 0.1 0.1 0.1 0.0 R, CM   Immature Granulocytes 0 0 1    Immature Grans (Abs) 0.0 0.0 0.0             ____________________________________________  EKG Sinus rhythm at 66  bpm  ____________________________________________   ____________________________________________   INITIAL IMPRESSION / ASSESSMENT AND PLAN / ED COURSE  As part of my medical decision making, I reviewed the following data within the electronic MEDICAL RECORD NUMBER      No acute findings on physical exam or EKG.  Lab reveals mixed hyperlipidemia.  Patient voices GERD symptoms.  Patient amenable to starting a statin.  Patient also given a prescription of 40 mg of Pepcid.        ____________________________________________   FINAL CLINICAL IMPRESSION Annual physical exam   ED Discharge Orders     None        Note:  This document was prepared using Dragon voice recognition software and may include unintentional dictation errors.

## 2024-07-17 ENCOUNTER — Ambulatory Visit: Payer: Self-pay | Admitting: Physician Assistant

## 2024-07-17 ENCOUNTER — Encounter: Payer: Self-pay | Admitting: Physician Assistant

## 2024-07-17 VITALS — BP 134/92 | HR 82 | Temp 98.4°F | Resp 12

## 2024-07-17 DIAGNOSIS — L237 Allergic contact dermatitis due to plants, except food: Secondary | ICD-10-CM

## 2024-07-17 MED ORDER — METHYLPREDNISOLONE SODIUM SUCC 40 MG IJ SOLR
40.0000 mg | Freq: Once | INTRAMUSCULAR | Status: AC
  Administered 2024-07-17: 40 mg via INTRAMUSCULAR

## 2024-07-17 MED ORDER — METHYLPREDNISOLONE 4 MG PO TBPK: ORAL_TABLET | ORAL | 0 refills | Status: AC

## 2024-07-17 MED ORDER — HYDROXYZINE PAMOATE 25 MG PO CAPS: 25.0000 mg | ORAL_CAPSULE | Freq: Three times a day (TID) | ORAL | 0 refills | Status: AC | PRN

## 2024-07-17 NOTE — Addendum Note (Signed)
 Addended by: ELUTERIO JENKINS HERO on: 07/17/2024 02:02 PM   Modules accepted: Orders

## 2024-07-17 NOTE — Progress Notes (Signed)
 Moved a tree out of the road 4-5 days ago & then the rash  came out about 48 hours.  Rash came up two days ago on left side of face/neck Has rash on inner thighs & a few places on hands & arms  Yesterday took Benadryl  & Allegra  Has only take ibuprofen today  Using anti-itch topical lotion, cool compresses & showers  No vision impairment - eyelid swollen  Feels like it is affecting hearing in left ear - can still hear but is a little off  The rash itches

## 2024-07-17 NOTE — Progress Notes (Signed)
   Subjective: Rash    Patient ID: Keddrick Wyne, male    DOB: 31-Aug-1991, 33 y.o.   MRN: [id number]  HPI Patient complaining of rash to facial area and left side of neck.  Patient also has a rash on the bilateral medial thigh.  Patient was moving a tree and 2 days later he developed this rash.  States no relief with Benadryl  and Allegra.  States left ear has muffled sounds.  Itching has intense in the last 24 hours.   Review of Systems Negative except for above complaint    Objective:   Physical Exam BP 134/92  BP Location Left Arm  Patient Position Sitting  Cuff Size Large  Pulse Rate 82  Temp 98.4 F (36.9 C)  Temp Source Temporal  Resp 12  SpO2 97 %  Diffuse papular vesicle lesions facial area, left neck, bilateral hand, bilateral medial thigh.       Assessment & Plan: Contact dermatitis  Patient given 40 mg of Solu-Medrol  in clinic.  Patient given prescription for Medrol  Dosepak and Atarax .  Patient given a work note for 2 days.  Advised return back if worsening of complaint

## 2024-08-20 ENCOUNTER — Other Ambulatory Visit: Payer: Self-pay

## 2024-08-20 DIAGNOSIS — K2100 Gastro-esophageal reflux disease with esophagitis, without bleeding: Secondary | ICD-10-CM

## 2024-08-20 MED ORDER — FAMOTIDINE 40 MG PO TABS: 40.0000 mg | ORAL_TABLET | Freq: Every day | ORAL | 3 refills | Status: AC
